# Patient Record
Sex: Male | Born: 1995 | Race: White | Hispanic: No | Marital: Single | Smoking: Current every day smoker
Health system: Southern US, Academic
[De-identification: ages and names within clinical notes are randomized; demographics above are authoritative.]

## PROBLEM LIST (undated history)

## (undated) HISTORY — PX: HX EAR TUBES: 2100001170

---

## 2018-06-29 ENCOUNTER — Other Ambulatory Visit: Payer: Self-pay

## 2018-06-29 ENCOUNTER — Emergency Department
Admission: EM | Admit: 2018-06-29 | Discharge: 2018-06-29 | Disposition: A | Discharge status: Home / Self Care | Payer: BC Managed Care – PPO | Attending: Emergency Medicine | Admitting: Emergency Medicine

## 2018-06-29 ENCOUNTER — Emergency Department (HOSPITAL_COMMUNITY): Payer: BC Managed Care – PPO

## 2018-06-29 ENCOUNTER — Encounter (HOSPITAL_COMMUNITY): Payer: Self-pay

## 2018-06-29 DIAGNOSIS — S060XAA Concussion with loss of consciousness status unknown, initial encounter: Secondary | ICD-10-CM

## 2018-06-29 DIAGNOSIS — S0101XA Laceration without foreign body of scalp, initial encounter: Secondary | ICD-10-CM

## 2018-06-29 DIAGNOSIS — W228XXA Striking against or struck by other objects, initial encounter: Secondary | ICD-10-CM | POA: Insufficient documentation

## 2018-06-29 DIAGNOSIS — S060X0A Concussion without loss of consciousness, initial encounter: Secondary | ICD-10-CM | POA: Insufficient documentation

## 2018-06-29 DIAGNOSIS — F17210 Nicotine dependence, cigarettes, uncomplicated: Secondary | ICD-10-CM | POA: Insufficient documentation

## 2018-06-29 DIAGNOSIS — Z23 Encounter for immunization: Secondary | ICD-10-CM | POA: Insufficient documentation

## 2018-06-29 DIAGNOSIS — S060X9A Concussion with loss of consciousness of unspecified duration, initial encounter: Secondary | ICD-10-CM

## 2018-06-29 MED ORDER — DIPHTH,PERTUSSIS(ACEL),TETANUS 2.5 LF UNIT-8 MCG-5 LF/0.5ML IM SYRINGE
0.50 mL | INJECTION | INTRAMUSCULAR | Status: AC
Start: 2018-06-29 — End: 2018-06-29
  Administered 2018-06-29: 0.5 mL via INTRAMUSCULAR
  Filled 2018-06-29: qty 0.5

## 2018-06-29 MED ORDER — ACETAMINOPHEN 500 MG TABLET
1000.00 mg | ORAL_TABLET | ORAL | Status: AC
Start: 2018-06-29 — End: 2018-06-29
  Administered 2018-06-29: 1000 mg via ORAL
  Filled 2018-06-29: qty 2

## 2018-06-29 MED ADMIN — sodium chloride 0.9 % (flush) injection syringe: ORAL | @ 21:00:00

## 2018-06-29 NOTE — ED Provider Notes (Signed)
Emergency Department  Provider Note  HPI - 06/29/2018    Name: Dwayne Bautista  Age and Gender: 23 y.o. male  Attending: Dr. Jen Mow  APP: Renne Musca, PA-C    Chief Complaint   Patient presents with   . Head Pain       HPI:  Dwayne Bautista is a 23 y.o. male  who presents to the Emergency Department today for evaluation of headache. The patient reports that he was crouched down painting at home this afternoon around 1300 and when he went to stand, he smacked the back of his head on the corner of a cabinet. He also states that upon standing he has a brief episode of tingling to the R lower extremity but states it has since resolved. He relates that he has been having a headache with light sensitivity since the incident. He has not taken anything yet for the pain. He rates the pain a 5/10 and is not exacerbated or relieved by anything. He denies LOC, nausea/vomiting, and all other symptoms/complaints at this time. He has no pertinent medical or surgical history. No known allergies.     PCP: No Pcp    Location: Head  Quality: Pain  Onset: 1300 today  Severity: 5/10  Timing: Still present  Context: See HPI  Modifying factors: Nothing  Associated symptoms: Positive for headache and light sensitivity. Negative for LOC and nausea/vomiting.     History provided by: Patient     Review of Systems:    Constitutional: No fever, chills, or weakness.  Skin: No rashes or lesions. No diaphoresis.  HENT: No head injury. No sore throat, ear pain, or difficulty swallowing.  Eyes: +light sensitivity No vision changes, redness, or discharge.  Cardio: No chest pain or palpitations.  Respiratory: No cough, wheezing, or SOB.  GI: No nausea/vomiting. No diarrhea or constipation. No abdominal pain.   GU: No dysuria, hematuria, or polyuria.  MSK: No joint pain. No neck or back pain.  Neuro: +headache No loss of sensation, focal deficits, or LOC.  All other systems reviewed and are negative, unless commented on in the HPI.       No  current outpatient medications on file.       No Known Allergies       Past Medical History:  History reviewed. No pertinent past medical history.    Past Surgical History:  Past Surgical History:   Procedure Laterality Date   . Hx ear tubes         Social History:  Social History     Tobacco Use   . Smoking status: Current Every Day Smoker     Packs/day: 1.00   . Smokeless tobacco: Never Used   Substance Use Topics   . Alcohol use: Yes     Comment: rare   . Drug use: Never     Social History     Substance and Sexual Activity   Drug Use Never       Family History:  No family history on file.    Above history reviewed with patient.  Allergies, medication list, and old records also reviewed.     Objective:  Nursing notes reviewed    Filed Vitals:    06/29/18 2039   BP: (!) 147/89   Pulse: 84   Resp: 18   Temp: 36.4 C (97.6 F)   SpO2: 98%       Physical Exam  Nursing note and vitals reviewed. Vital signs reviewed as above.  Constitutional: Pt is well-developed and well-nourished.   Head: Superficial 2cm abrasion to the scalp. No active bleeding. Normocephalic.  ENT: TM's are normal. No erythema or bulging. Airway patent. No trismus. No pharyngeal erythema.   Eyes: Conjunctivae are normal. Pupils are equal, round, and reactive to light. EOM are intact  Neck: Soft, supple, full range of motion.  Cardiovascular: RRR. No Murmurs/rubs/gallops. Distal pulses present and equal bilaterally.  Pulmonary/Chest: Normal BS BL with no distress. No audible wheezes or crackles are noted.  GI: Soft, nontender, nondistended. No rebound, guarding, or masses.  Musculoskeletal: Normal range of motion. No deformities.  Exhibits no edema and no tenderness.   Neurological: CNs 2-12 grossly intact.  No focal deficits noted.  Skin: Warm and dry. No rash or lesions.  Psychiatric: Patient has a normal mood and affect.     Work-up:  Orders Placed This Encounter   . CT BRAIN WO IV CONTRAST   . SCHEDULE FOLLOW-UP - [facility] - PRIMARY CARE  UNASSIGNED (3 Days)   . acetaminophen (TYLENOL) tablet   . diphtheria, pertussis-acell, tetanus (BOOSTRIX) IM injection        Imaging:     Results for orders placed or performed during the hospital encounter of 06/29/18 (from the past 72 hour(s))   CT BRAIN WO IV CONTRAST     Status: None    Narrative    Male, 23 years old.    CT BRAIN WO IV CONTRAST performed on 06/29/2018 9:53 PM.    REASON FOR EXAM:  injury  RADIATION DOSE: DLP  1253 mGycm  TECHNIQUE: This CT scanner is equipped with dose reducing technology. The  exposure is automatically adjusted according to patient body size in order  to deliver the lowest dose possible.    COMPARISON: None.    Technique: CT examination of the brain obtained without intravenous  contrast.    Findings:  No evidence of acute intracranial mass, hemorrhage, infarct, or edema.   The ventricles are normal in size and configuration.   The paranasal sinuses and mastoid air cells are clear.   No skull fracture identified.       Impression    Impression:  No acute intracranial abnormality.        Radiologist location ID: [id number]       Plan: Appropriate imaging ordered. Medical Records reviewed.    MDM:    During the patient's stay in the emergency department, the above listed imaging was performed to assist with medical decision making and were reviewed by myself when available for review. Results were discussed with the patient.   Pt remained stable throughout the emergency department course.     Advised to return to the ED with any new, worsening, or concerning symptoms.   Instructed to follow up with PCP to be rechecked.    Patient was given the opportunity to ask questions. All questions were answered and the patient is in agreement with plan of discharge.    ED Course as of Jun 29 2208   Tue Jun 29, 2018   2205 Patient is alert, stable, afebrile, nontoxic appearing, and neuro intact.   Discussed with patient that small fractures may be missed on XR and that if they have  continued symptoms may need a repeat XR in 7 days.   Discussed brain rest and concussion care.   Tetanus given in ED.     [TT]      ED Course User Index  [TT] Renne Musca, PA-C  Consults:    None    Impression:   Encounter Diagnoses   Name Primary?   . Concussion Yes   . Scalp laceration      Disposition:  Discharged   It was advised that the patient return to the ED with any new, concerning or worsening symptoms.   The patient verbalized understanding of all instructions and had no further questions or concerns.    Discussed with patient all imaging results, diagnosis, treatment, and need for follow up.     Follow up:   Olive BassKhosrovi, Houman, MD  [address]  [address]  [place] [id number]  [phone]      As needed    [facility]  [address]  [place] [id number]  [phone]    As needed      I am scribing for, and in the presence of, Renne Muscaabitha Meilah Delrosario, [place]-C for services provided on 06/29/2018.  Larita FifeAshley Sunderman, SCRIBE     CrestonAshley Sunderman, [place]IBE  06/29/2018, 20:49    The co-signing faculty was physically present in the emergency department and available for consultation.  I personally performed the services described in this documentation, as scribed  in my presence, and it is both accurate  and complete.    Renne Muscaabitha Dorsel Flinn, PA-C  06/30/2018, 01:17

## 2018-06-29 NOTE — Discharge Instructions (Addendum)
Stay well hydrated  May use Tylenol and ibuprofen over-the-counter as directed for pain management  Return to ED as needed or for worsening of symptoms or concerns  Performed brain rest as directed.  Avoid using cell phone, tablets, video games, computer, etc  Avoid driving and operating heavy machinery.  Avoid a repeat injuries to your head at this time.

## 2018-06-29 NOTE — ED Triage Notes (Signed)
Pt states he hit his head on a cabinet today and is now having head pain and intermittent dizziness. Pt denies LOC. Pt did state that immediately after hitting his head his right leg went numb but it's not numb at this time. Pt also noted that he had some blood on the top of his head where he hit the cabinet.

## 2021-09-25 IMAGING — CR DX Chest X-Ray Portable
1 series · 1 of 1 positions shown · non-contrast
Comparison: none

Chest
HISTORY: Chest pain.

[AP]
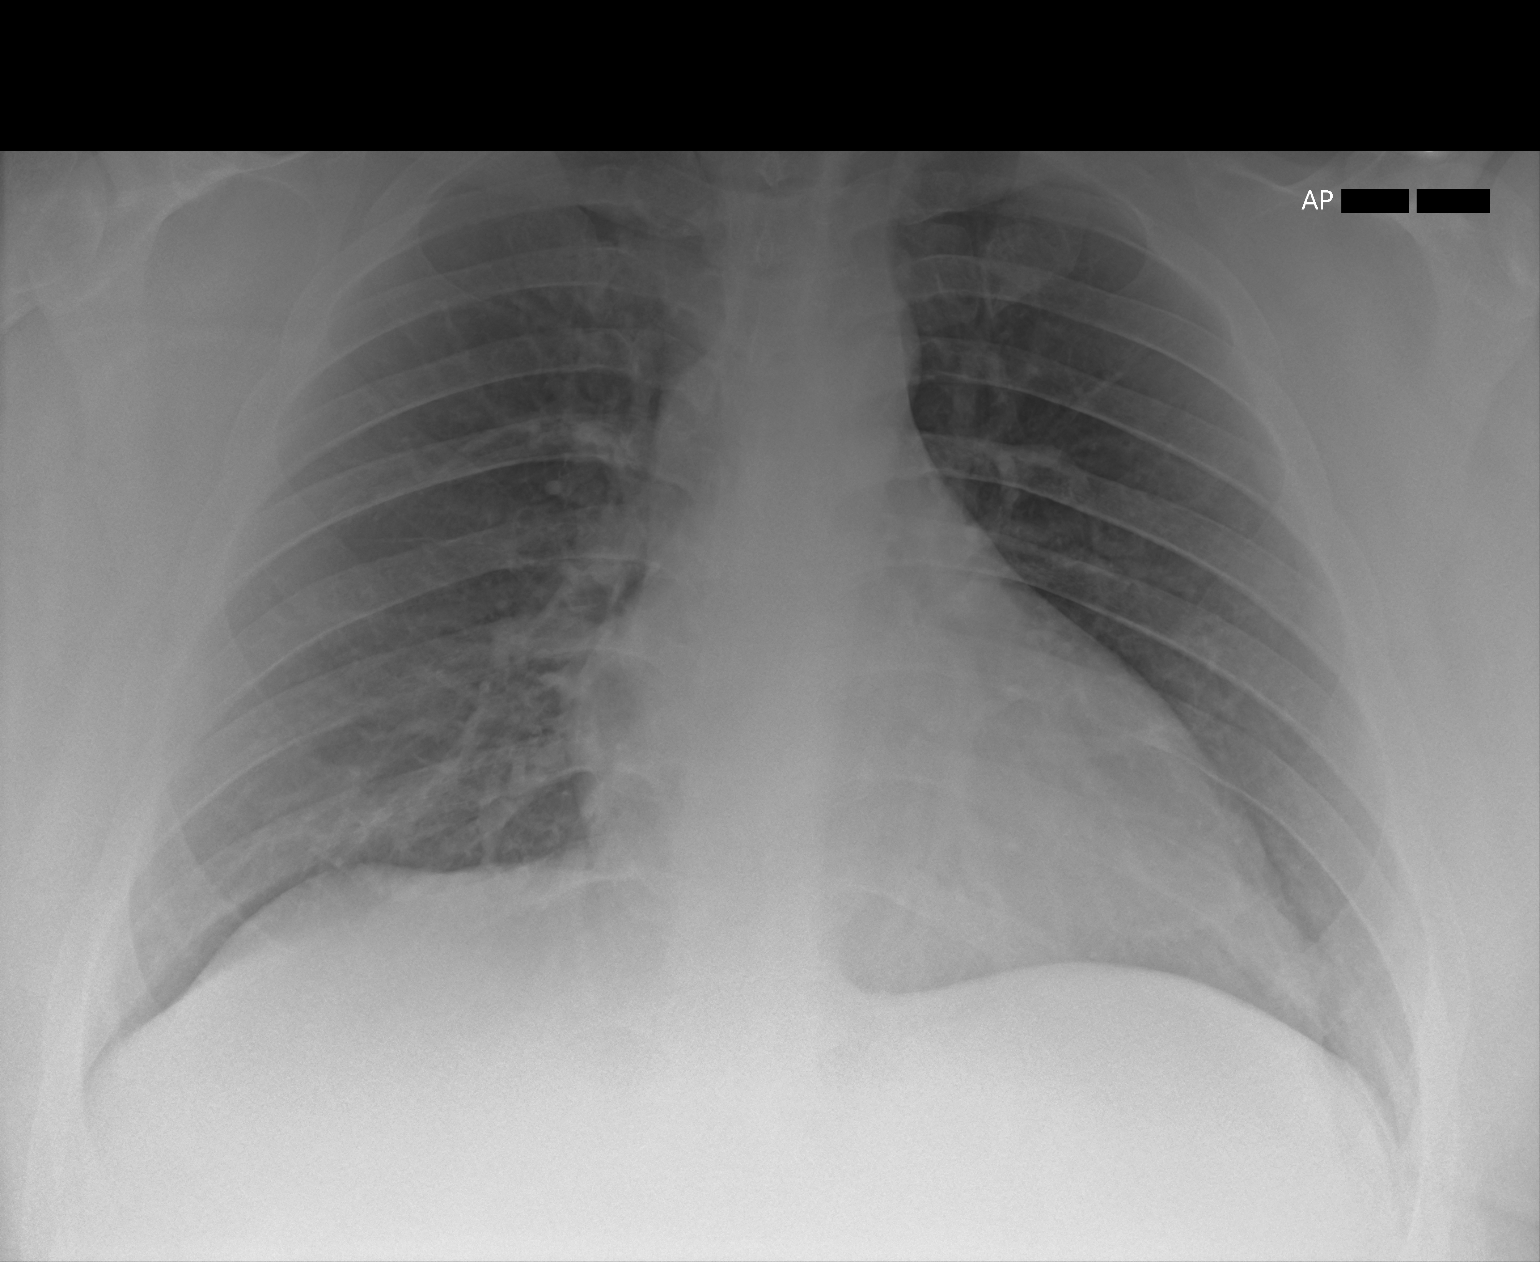

[1 of 1 positions shown; findings below may reference images not displayed]

FINDINGS: Heart size upper limits normal. Pulmonary vascularity and mediastinum within              
 normal limits.                                                                            
 No airspace disease, effusions or pneumothorax. Minimal elevation/eventration             
 right hemidiaphragm.
IMPRESSION: 1. No acute cardiopulmonary disease.
